# Patient Record
Sex: Male | Born: 1975 | Race: White | Hispanic: No | Marital: Married | Smoking: Former smoker
Health system: Southern US, Community
[De-identification: ages and names within clinical notes are randomized; demographics above are authoritative.]

---
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
## 2012-04-10 ENCOUNTER — Other Ambulatory Visit: Payer: Self-pay | Admitting: Occupational Medicine

## 2012-04-10 ENCOUNTER — Ambulatory Visit (HOSPITAL_BASED_OUTPATIENT_CLINIC_OR_DEPARTMENT_OTHER)
Admission: RE | Admit: 2012-04-10 | Discharge: 2012-04-10 | Disposition: A | Discharge status: Home / Self Care | Payer: Self-pay | Source: Ambulatory Visit | Attending: Occupational Medicine | Admitting: Occupational Medicine

## 2012-04-10 DIAGNOSIS — Z0000 Encounter for general adult medical examination without abnormal findings: Secondary | ICD-10-CM

## 2014-04-29 IMAGING — CR DG CHEST 1V
1 series · 1 of 1 positions shown · non-contrast
Comparison: No priors.

CLINICAL DATA: Annual physical examination.

CHEST - 1 VIEW

[w chest pa]
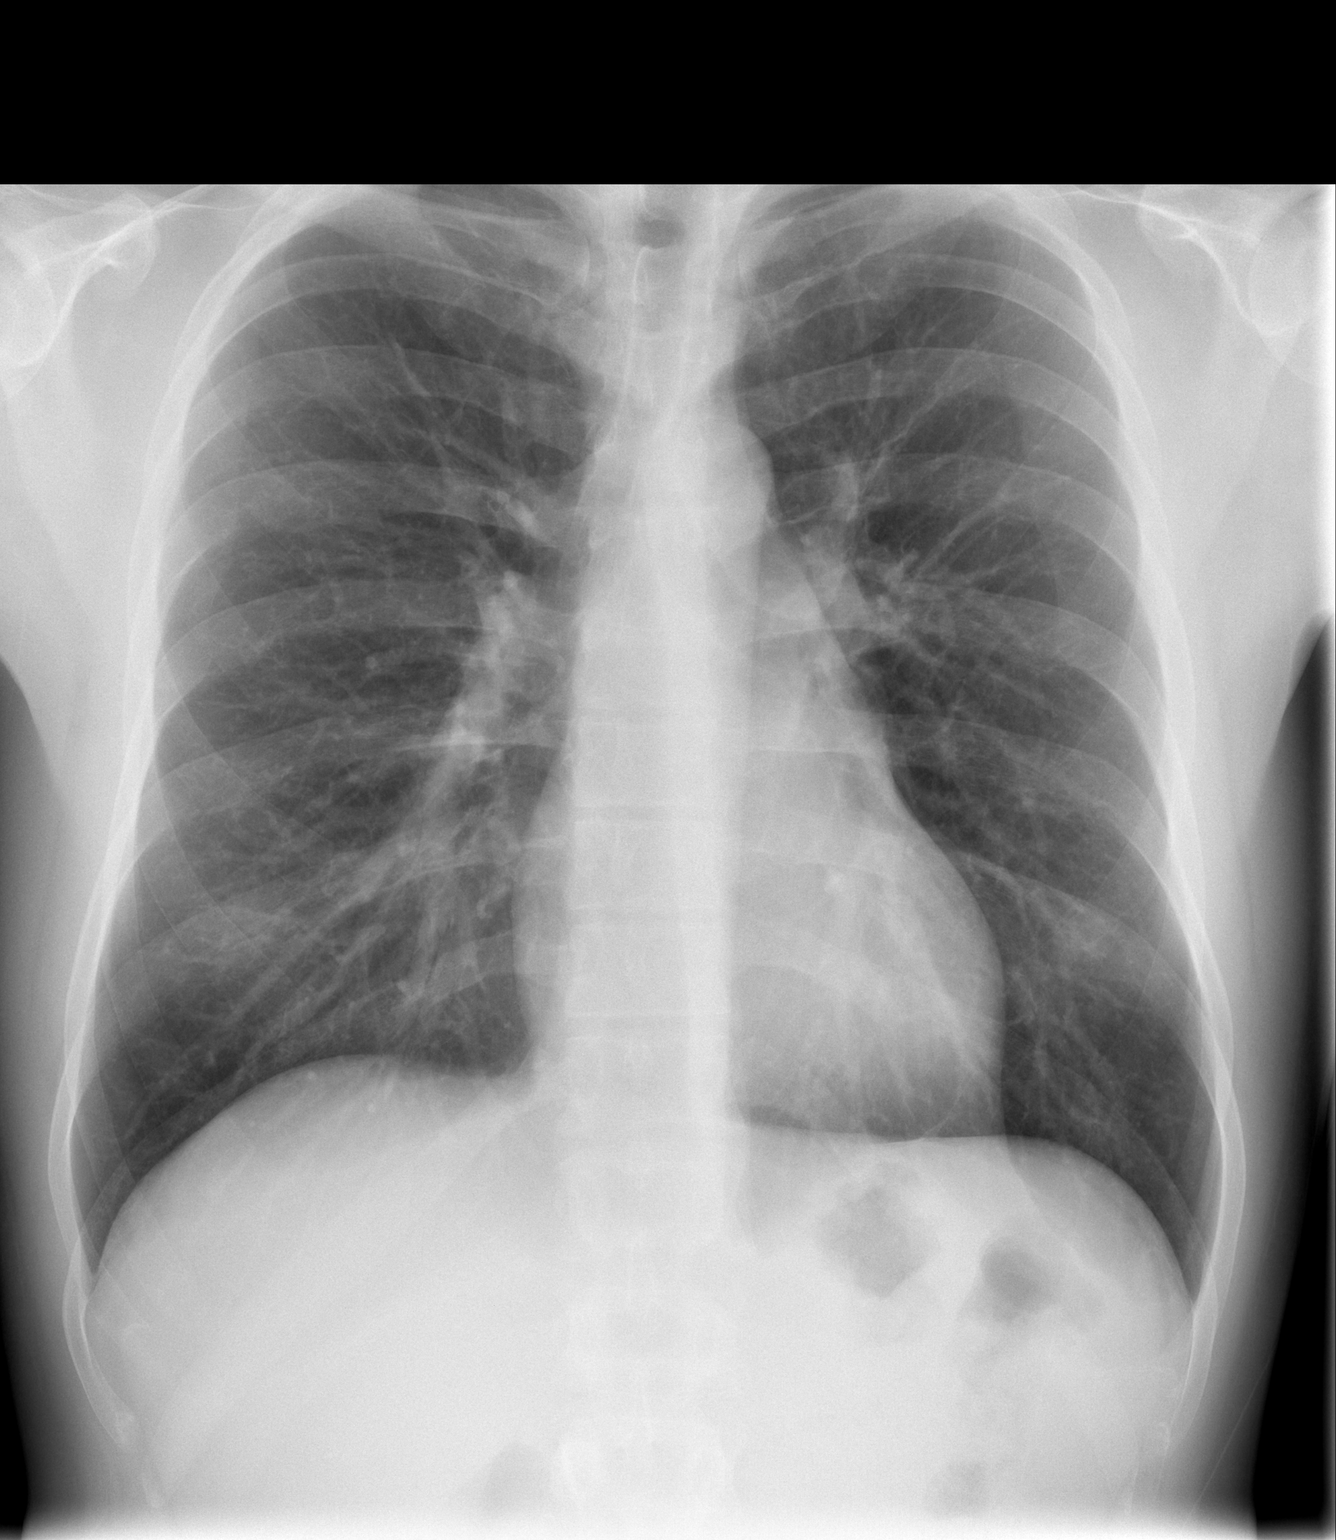

[1 of 1 positions shown; findings below may reference images not displayed]

FINDINGS: Lung volumes are normal.  No consolidative airspace
disease.  No pleural effusions.  No pneumothorax.  No pulmonary
nodule or mass noted.  Pulmonary vasculature and the
cardiomediastinal silhouette are within normal limits.
IMPRESSION: 1. No radiographic evidence of acute cardiopulmonary disease.

## 2018-08-06 LAB — HM COLONOSCOPY

## 2019-03-05 LAB — CBC: RBC: 5 (ref 3.87–5.11)

## 2019-03-05 LAB — LIPID PANEL
Cholesterol: 200 (ref 0–200)
HDL: 47 (ref 35–70)
LDL Cholesterol: 141
LDl/HDL Ratio: 3
Triglycerides: 59 (ref 40–160)

## 2019-03-05 LAB — CBC AND DIFFERENTIAL
HCT: 46 (ref 41–53)
Hemoglobin: 16.1 (ref 13.5–17.5)
Platelets: 233 (ref 150–399)
WBC: 6.3

## 2019-03-05 LAB — BASIC METABOLIC PANEL
BUN: 14 (ref 4–21)
CO2: 23 — AB (ref 13–22)
Chloride: 101 (ref 99–108)
Creatinine: 0.9 (ref 0.6–1.3)
Glucose: 88
Potassium: 4.5 (ref 3.4–5.3)
Sodium: 139 (ref 137–147)

## 2019-03-05 LAB — HEPATIC FUNCTION PANEL
ALT: 34 (ref 10–40)
AST: 29 (ref 14–40)
Alkaline Phosphatase: 68 (ref 25–125)
Bilirubin, Total: 0.7

## 2019-03-05 LAB — COMPREHENSIVE METABOLIC PANEL
Albumin: 4.9 (ref 3.5–5.0)
Calcium: 9.6 (ref 8.7–10.7)
Globulin: 2.4

## 2019-03-05 LAB — PSA: PSA: 0.6

## 2019-03-05 LAB — TSH: TSH: 1.81 (ref 0.41–5.90)

## 2020-02-10 LAB — LIPID PANEL
Cholesterol: 215 — AB (ref 0–200)
HDL: 53 (ref 35–70)
LDL Cholesterol: 151
Triglycerides: 60 (ref 40–160)

## 2020-02-10 LAB — CBC AND DIFFERENTIAL
HCT: 46 (ref 41–53)
Hemoglobin: 16.3 (ref 13.5–17.5)
Neutrophils Absolute: 3.5
Platelets: 248 (ref 150–399)
WBC: 6.2

## 2020-02-10 LAB — HEPATIC FUNCTION PANEL
ALT: 33 (ref 10–40)
AST: 26 (ref 14–40)
Alkaline Phosphatase: 75 (ref 25–125)
Bilirubin, Total: 0.7

## 2020-02-10 LAB — COMPREHENSIVE METABOLIC PANEL
Albumin: 4.5 (ref 3.5–5.0)
Calcium: 9.6 (ref 8.7–10.7)
Globulin: 2.8

## 2020-02-10 LAB — BASIC METABOLIC PANEL
BUN: 15 (ref 4–21)
CO2: 21 (ref 13–22)
Chloride: 102 (ref 99–108)
Creatinine: 1 (ref 0.6–1.3)
Glucose: 92
Potassium: 4.5 (ref 3.4–5.3)
Sodium: 138 (ref 137–147)

## 2020-02-10 LAB — TSH: TSH: 1.18 (ref 0.41–5.90)

## 2020-02-10 LAB — PSA: PSA: 0.7

## 2020-02-10 LAB — CBC: RBC: 4.98 (ref 3.87–5.11)

## 2020-07-15 ENCOUNTER — Ambulatory Visit (INDEPENDENT_AMBULATORY_CARE_PROVIDER_SITE_OTHER): Payer: Managed Care, Other (non HMO) | Admitting: Family Medicine

## 2020-07-15 ENCOUNTER — Encounter: Payer: Self-pay | Admitting: Family Medicine

## 2020-07-15 ENCOUNTER — Other Ambulatory Visit: Payer: Self-pay

## 2020-07-15 DIAGNOSIS — Z0000 Encounter for general adult medical examination without abnormal findings: Secondary | ICD-10-CM

## 2020-07-15 LAB — PROTEIN, TOTAL: Protein, Total: 7.3

## 2020-07-15 NOTE — Patient Instructions (Signed)
Great to meet you today! Let's plan to follow up annually or as needed.  Let me know if ear issues change/worsen.    Preventive Care 68-44 Years Old, Male Preventive care refers to lifestyle choices and visits with your health care provider that can promote health and wellness. This includes:  A yearly physical exam. This is also called an annual well check.  Regular dental and eye exams.  Immunizations.  Screening for certain conditions.  Healthy lifestyle choices, such as eating a healthy diet, getting regular exercise, not using drugs or products that contain nicotine and tobacco, and limiting alcohol use. What can I expect for my preventive care visit? Physical exam Your health care provider will check:  Height and weight. These may be used to calculate body mass index (BMI), which is a measurement that tells if you are at a healthy weight.  Heart rate and blood pressure.  Your skin for abnormal spots. Counseling Your health care provider may ask you questions about:  Alcohol, tobacco, and drug use.  Emotional well-being.  Home and relationship well-being.  Sexual activity.  Eating habits.  Work and work Statistician. What immunizations do I need?  Influenza (flu) vaccine  This is recommended every year. Tetanus, diphtheria, and pertussis (Tdap) vaccine  You may need a Td booster every 10 years. Varicella (chickenpox) vaccine  You may need this vaccine if you have not already been vaccinated. Zoster (shingles) vaccine  You may need this after age 52. Measles, mumps, and rubella (MMR) vaccine  You may need at least one dose of MMR if you were born in 1957 or later. You may also need a second dose. Pneumococcal conjugate (PCV13) vaccine  You may need this if you have certain conditions and were not previously vaccinated. Pneumococcal polysaccharide (PPSV23) vaccine  You may need one or two doses if you smoke cigarettes or if you have certain  conditions. Meningococcal conjugate (MenACWY) vaccine  You may need this if you have certain conditions. Hepatitis A vaccine  You may need this if you have certain conditions or if you travel or work in places where you may be exposed to hepatitis A. Hepatitis B vaccine  You may need this if you have certain conditions or if you travel or work in places where you may be exposed to hepatitis B. Haemophilus influenzae type b (Hib) vaccine  You may need this if you have certain risk factors. Human papillomavirus (HPV) vaccine  If recommended by your health care provider, you may need three doses over 6 months. You may receive vaccines as individual doses or as more than one vaccine together in one shot (combination vaccines). Talk with your health care provider about the risks and benefits of combination vaccines. What tests do I need? Blood tests  Lipid and cholesterol levels. These may be checked every 5 years, or more frequently if you are over 58 years old.  Hepatitis C test.  Hepatitis B test. Screening  Lung cancer screening. You may have this screening every year starting at age 81 if you have a 30-pack-year history of smoking and currently smoke or have quit within the past 15 years.  Prostate cancer screening. Recommendations will vary depending on your family history and other risks.  Colorectal cancer screening. All adults should have this screening starting at age 87 and continuing until age 56. Your health care provider may recommend screening at age 17 if you are at increased risk. You will have tests every 1-10 years, depending on  your results and the type of screening test.  Diabetes screening. This is done by checking your blood sugar (glucose) after you have not eaten for a while (fasting). You may have this done every 1-3 years.  Sexually transmitted disease (STD) testing. Follow these instructions at home: Eating and drinking  Eat a diet that includes fresh  fruits and vegetables, whole grains, lean protein, and low-fat dairy products.  Take vitamin and mineral supplements as recommended by your health care provider.  Do not drink alcohol if your health care provider tells you not to drink.  If you drink alcohol: ? Limit how much you have to 0-2 drinks a day. ? Be aware of how much alcohol is in your drink. In the [place] one drink equals one 12 oz bottle of beer (355 mL), one 5 oz glass of wine (148 mL), or one 1 oz glass of hard liquor (44 mL). Lifestyle  Take daily care of your teeth and gums.  Stay active. Exercise for at least 30 minutes on 5 or more days each week.  Do not use any products that contain nicotine or tobacco, such as cigarettes, e-cigarettes, and chewing tobacco. If you need help quitting, ask your health care provider.  If you are sexually active, practice safe sex. Use a condom or other form of protection to prevent STIs (sexually transmitted infections).  Talk with your health care provider about taking a low-dose aspirin every day starting at age 42. What's next?  Go to your health care provider once a year for a well check visit.  Ask your health care provider how often you should have your eyes and teeth checked.  Stay up to date on all vaccines. This information is not intended to replace advice given to you by your health care provider. Make sure you discuss any questions you have with your health care provider. Document Revised: 07/12/2018 Document Reviewed: 07/12/2018 Elsevier Patient Education  2020 [employer].

## 2020-07-15 NOTE — Progress Notes (Signed)
Keith Wood - 44 y.o. male MRN [id number]  Date of birth: [birth year]  Subjective No chief complaint on file.   HPI Keith Wood is a 44 y.o. male here today for initial visit and annual exam.  He has been fairly healthy.  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 Williams Che.  He had labs drawn in July of this year which were all normal except for LDL of 151.  No family history of early heart disease.    He remains very active but doesn't feel like he recovers quite as quickly as he used to.  He feels like his diet is pretty good.   He is a former smoker.  He does still dip.   He consumes EtOH occasionally.   He has had feeling of ear fullness off and on for several weeks.  No dizziness or significant pain.   Review of Systems  Constitutional: Negative for chills, fever, malaise/fatigue and weight loss.  HENT: Negative for congestion, ear pain and sore throat.   Eyes: Negative for blurred vision, double vision and pain.  Respiratory: Negative for cough and shortness of breath.   Cardiovascular: Negative for chest pain and palpitations.  Gastrointestinal: Negative for abdominal pain, blood in stool, constipation, heartburn and nausea.  Genitourinary: Negative for dysuria and urgency.  Musculoskeletal: Negative for joint pain and myalgias.  Neurological: Negative for dizziness and headaches.  Endo/Heme/Allergies: Does not bruise/bleed easily.  Psychiatric/Behavioral: Negative for depression. The patient is not nervous/anxious and does not have insomnia.     No Known Allergies  History reviewed. No pertinent past medical history.  Past Surgical History:  Procedure Laterality Date  . FRACTURE SURGERY Left 08/02/1991  . FRACTURE SURGERY  08/01/1981    Social History   Socioeconomic History  . Marital status: Married    Spouse name: Not on file  . Number of children: Not on file  . Years of education: Not on file  . Highest  education level: Not on file  Occupational History  . Occupation: IT sales professional  Tobacco Use  . Smoking status: Former Smoker    Types: Cigarettes  . Smokeless tobacco: Former Neurosurgeon    Types: Chew, Snuff  Vaping Use  . Vaping Use: Never used  Substance and Sexual Activity  . Alcohol use: Yes    Comment: sometimes  . Drug use: Never  . Sexual activity: Yes    Partners: Female  Other Topics Concern  . Not on file  Social History Narrative  . Not on file   Social Determinants of Health   Financial Resource Strain: Not on file  Food Insecurity: Not on file  Transportation Needs: Not on file  Physical Activity: Not on file  Stress: Not on file  Social Connections: Not on file    Family History  Problem Relation Age of Onset  . Hypertension Mother     Health Maintenance  Topic Date Due  . Hepatitis C Screening  Never done  . COVID-19 Vaccine (1) Never done  . HIV Screening  Never done  . TETANUS/TDAP  Never done  . INFLUENZA VACCINE  Completed     ----------------------------------------------------------------------------------------------------------------------------------------------------------------------------------------------------------------- Physical Exam BP 135/83   Pulse 80   Temp 98.5 F (36.9 C)   Wt 196 lb 12.8 oz (89.3 kg)   SpO2 98%   Physical Exam Constitutional:      General: He is not in acute distress.    Appearance: He is well-nourished.  HENT:     Head: Normocephalic and atraumatic.     Right Ear: Tympanic membrane and external ear normal.     Left Ear: Tympanic membrane and external ear normal.     Mouth/Throat:     Mouth: Oropharynx is clear and moist.  Eyes:     General: No scleral icterus. Neck:     Thyroid: No thyromegaly.  Cardiovascular:     Rate and Rhythm: Normal rate and regular rhythm.     Pulses: Intact distal pulses.     Heart sounds: Normal heart sounds.  Pulmonary:     Effort: Pulmonary effort is normal.      Breath sounds: Normal breath sounds.  Abdominal:     General: Bowel sounds are normal. There is no distension.     Palpations: Abdomen is soft.     Tenderness: There is no abdominal tenderness. There is no guarding.  Musculoskeletal:        General: No edema.     Cervical back: Normal range of motion.  Lymphadenopathy:     Cervical: No cervical adenopathy.  Skin:    General: Skin is warm and dry.     Findings: No rash.  Neurological:     Mental Status: He is alert and oriented to person, place, and time.     Cranial Nerves: No cranial nerve deficit.     Motor: No abnormal muscle tone.  Psychiatric:        Mood and Affect: Mood and affect and mood normal.        Behavior: Behavior normal.     ------------------------------------------------------------------------------------------------------------------------------------------------------------------------------------------------------------------- Assessment and Plan  Well adult exam Well adult Outside labs reviewed and will be abstracted in.  Immunizations: Flu vaccine given today.  He is UTD on tetanus Screening: UTD Anticipatory guidance/Risk factor reduction:  Recommendations per AVS  Fullness in his ear may be coming from transient fluid due to eustachian tube dysfunction vs TMJ issues as he does have some bruxism.  He recently started using a night guard and will keep me updated on if this is helpful.     No orders of the defined types were placed in this encounter.   No follow-ups on file.    This visit occurred during the SARS-CoV-2 public health emergency.  Safety protocols were in place, including screening questions prior to the visit, additional usage of staff PPE, and extensive cleaning of exam room while observing appropriate contact time as indicated for disinfecting solutions.

## 2020-07-15 NOTE — Assessment & Plan Note (Signed)
Well adult Outside labs reviewed and will be abstracted in.  Immunizations: Flu vaccine given today.  He is UTD on tetanus Screening: UTD Anticipatory guidance/Risk factor reduction:  Recommendations per AVS  Fullness in his ear may be coming from transient fluid due to eustachian tube dysfunction vs TMJ issues as he does have some bruxism.  He recently started using a night guard and will keep me updated on if this is helpful.

## 2020-07-17 ENCOUNTER — Encounter: Payer: Self-pay | Admitting: Family Medicine

## 2020-08-17 ENCOUNTER — Encounter: Payer: Self-pay | Admitting: Family Medicine
